# Patient Record
Sex: Female | Born: 1950 | Race: Black or African American | Hispanic: No | Marital: Single | Smoking: Never smoker
Health system: Southern US, Community
[De-identification: ages and names within clinical notes are randomized; demographics above are authoritative.]

## PROBLEM LIST (undated history)

## (undated) DIAGNOSIS — I10 Essential (primary) hypertension: Secondary | ICD-10-CM

## (undated) DIAGNOSIS — M1990 Unspecified osteoarthritis, unspecified site: Secondary | ICD-10-CM

## (undated) DIAGNOSIS — K219 Gastro-esophageal reflux disease without esophagitis: Secondary | ICD-10-CM

## (undated) DIAGNOSIS — E7800 Pure hypercholesterolemia, unspecified: Secondary | ICD-10-CM

## (undated) HISTORY — PX: ABDOMINAL HYSTERECTOMY: SHX81

## (undated) HISTORY — DX: Unspecified osteoarthritis, unspecified site: M19.90

## (undated) HISTORY — PX: CHOLECYSTECTOMY: SHX55

## (undated) HISTORY — PX: REPLACEMENT TOTAL KNEE BILATERAL: SUR1225

---

## 2015-10-05 ENCOUNTER — Emergency Department (HOSPITAL_COMMUNITY)
Admission: EM | Admit: 2015-10-05 | Discharge: 2015-10-05 | Disposition: A | Discharge status: Home / Self Care | Payer: Medicare Other | Attending: Emergency Medicine | Admitting: Emergency Medicine

## 2015-10-05 ENCOUNTER — Encounter (HOSPITAL_COMMUNITY): Payer: Self-pay | Admitting: Emergency Medicine

## 2015-10-05 DIAGNOSIS — L02221 Furuncle of abdominal wall: Secondary | ICD-10-CM | POA: Insufficient documentation

## 2015-10-05 DIAGNOSIS — L304 Erythema intertrigo: Secondary | ICD-10-CM | POA: Diagnosis not present

## 2015-10-05 DIAGNOSIS — L0292 Furuncle, unspecified: Secondary | ICD-10-CM

## 2015-10-05 DIAGNOSIS — R1904 Left lower quadrant abdominal swelling, mass and lump: Secondary | ICD-10-CM | POA: Diagnosis present

## 2015-10-05 LAB — CBG MONITORING, ED: GLUCOSE-CAPILLARY: 86 mg/dL (ref 65–99)

## 2015-10-05 MED ORDER — KETOCONAZOLE 2 % EX CREA: 1.0000 "application " | TOPICAL_CREAM | Freq: Every day | CUTANEOUS | Status: DC

## 2015-10-05 NOTE — Discharge Instructions (Signed)
Abscess An abscess is an infected area that contains a collection of pus and debris.It can occur in almost any part of the body. An abscess is also known as a furuncle or boil. CAUSES  An abscess occurs when tissue gets infected. This can occur from blockage of oil or sweat glands, infection of hair follicles, or a minor injury to the skin. As the body tries to fight the infection, pus collects in the area and creates pressure under the skin. This pressure causes pain. People with weakened immune systems have difficulty fighting infections and get certain abscesses more often.  SYMPTOMS Usually an abscess develops on the skin and becomes a painful mass that is red, warm, and tender. If the abscess forms under the skin, you may feel a moveable soft area under the skin. Some abscesses break open (rupture) on their own, but most will continue to get worse without care. The infection can spread deeper into the body and eventually into the bloodstream, causing you to feel ill.  DIAGNOSIS  Your caregiver will take your medical history and perform a physical exam. A sample of fluid may also be taken from the abscess to determine what is causing your infection. TREATMENT  Your caregiver may prescribe antibiotic medicines to fight the infection. However, taking antibiotics alone usually does not cure an abscess. Your caregiver may need to make a small cut (incision) in the abscess to drain the pus. In some cases, gauze is packed into the abscess to reduce pain and to continue draining the area. HOME CARE INSTRUCTIONS   Only take over-the-counter or prescription medicines for pain, discomfort, or fever as directed by your caregiver.  If you were prescribed antibiotics, take them as directed. Finish them even if you start to feel better.  If gauze is used, follow your caregiver's directions for changing the gauze.  To avoid spreading the infection:  Keep your draining abscess covered with a  bandage.  Wash your hands well.  Do not share personal care items, towels, or whirlpools with others.  Avoid skin contact with others.  Keep your skin and clothes clean around the abscess.  Keep all follow-up appointments as directed by your caregiver. SEEK MEDICAL CARE IF:   You have increased pain, swelling, redness, fluid drainage, or bleeding.  You have muscle aches, chills, or a general ill feeling.  You have a fever. MAKE SURE YOU:   Understand these instructions.  Will watch your condition.  Will get help right away if you are not doing well or get worse.   This information is not intended to replace advice given to you by your health care provider. Make sure you discuss any questions you have with your health care provider.   Document Released: 06/12/2005 Document Revised: 03/03/2012 Document Reviewed: 11/15/2011 Elsevier Interactive Patient Education 2016 [place] is a skin condition that occurs in between folds of skin in places on the body that rub together a lot and do not get much ventilation. It is caused by heat, moisture, friction, sweat retention, and lack of air circulation, which produces red, irritated patches and, sometimes, scaling or drainage. People who have diabetes, who are obese, or who have treatment with antibiotics are at increased risk for intertrigo. The most common sites for intertrigo to occur include:  The groin.  The breasts.  The armpits.  Folds of abdominal skin.  Webbed spaces between the fingers or toes. Intertrigo may be aggravated by:  Sweat.  Feces.  Yeast  or bacteria that are present near skin folds.  Urine.  Vaginal discharge. HOME CARE INSTRUCTIONS  The following steps can be taken to reduce friction and keep the affected area cool and dry:  Expose skin folds to the air.  Keep deep skin folds separated with cotton or linen cloth. Avoid tight fitting clothing that could cause  chafing.  Wear open-toed shoes or sandals to help reduce moisture between the toes.  Apply absorbent powders to affected areas as directed by your caregiver.  Apply over-the-counter barrier pastes, such as zinc oxide, as directed by your caregiver.  If you develop a fungal infection in the affected area, your caregiver may have you use antifungal creams. SEEK MEDICAL CARE IF:   The rash is not improving after 1 week of treatment.  The rash is getting worse (more red, more swollen, more painful, or spreading).  You have a fever or chills. MAKE SURE YOU:   Understand these instructions.  Will watch your condition.  Will get help right away if you are not doing well or get worse.   This information is not intended to replace advice given to you by your health care provider. Make sure you discuss any questions you have with your health care provider.   Document Released: 09/02/2005 Document Revised: 11/25/2011 Document Reviewed: 03/06/2015 Elsevier Interactive Patient Education [employer].

## 2015-10-05 NOTE — ED Notes (Signed)
Pt with abscess to lower abd pain that has purulent drainage x 3 days

## 2015-10-05 NOTE — ED Provider Notes (Signed)
CSN: [id number]     Arrival date & time 10/05/15  1028 History  By signing my name below, I, Essence Howell, attest that this documentation has been prepared under the direction and in the presence of Margarita Mail, PA-C Electronically Signed: Ladene Artist, ED Scribe 10/05/2015 at 11:33 AM.   Chief Complaint  Patient presents with  . Abscess   The history is provided by the patient. No language interpreter was used.   HPI Comments: Helen Fuller is a 64 y.o. female who presents to the Emergency Department complaining of an area of itching and swelling to the lower abdomen first noticed 4 days ago. Pt states that she first noticed the area with associated drainage 4 days ago. She has tried Vaseline and a bandage with mild relief. Pt denies fever, nausea, vomiting. She reports h/o abscesses.   History reviewed. No pertinent past medical history. History reviewed. No pertinent past surgical history. History reviewed. No pertinent family history. Social History  Substance Use Topics  . Smoking status: Never Smoker   . Smokeless tobacco: None  . Alcohol Use: No   OB History    No data available     Review of Systems  Constitutional: Negative for fever.  Gastrointestinal: Negative for nausea and vomiting.  Skin:       + Area of swelling to lower abdomen   Allergies  Tramadol  Home Medications   Prior to Admission medications   Not on File   BP 108/87 mmHg  Pulse 110  Temp(Src) 98.9 F (37.2 C) (Oral)  Resp 18  SpO2 95% Physical Exam  Constitutional: She is oriented to person, place, and time. She appears well-developed and well-nourished. No distress.  HENT:  Head: Normocephalic and atraumatic.  Eyes: Conjunctivae and EOM are normal.  Neck: Neck supple. No tracheal deviation present.  Cardiovascular: Normal rate.   Pulmonary/Chest: Effort normal. No respiratory distress.  Musculoskeletal: Normal range of motion.  Neurological: She is alert and oriented to person,  place, and time.  Skin: Skin is warm and dry.  Area of maceration under skin fold in L lower abdomen. Well-healing. No drainage, erythema, edema, cellulitis. Discoloration along skin fold, best suggested as yeast. Malodorous.   Psychiatric: She has a normal mood and affect. Her behavior is normal.  Nursing note and vitals reviewed.  ED Course  Procedures (including critical care time) DIAGNOSTIC STUDIES: Oxygen Saturation is 95% on RA, adequate by my interpretation.    COORDINATION OF CARE: 10:58 AM-Discussed treatment plan which includes CBG with pt at bedside and pt agreed to plan.   Labs Review Labs Reviewed  CBG MONITORING, ED   Imaging Review No results found. I have personally reviewed and evaluated these images and lab results as part of my medical decision-making.   EKG Interpretation None      MDM   Final diagnoses:  None   Pt has a well-healing small abscess on the underside of the abdominal pannus. No induration or fluctuance. Some ulceration of the tissue. No deeper abdominal tenderness. Her CBG is normal. No I&D neccessary. No concern for deeper infection. Has impetigo which we will treat with 2% ketoconazole and f/u with PCP. Discussed return precautions.   I personally performed the services described in this documentation, which was scribed in my presence. The recorded information has been reviewed and is accurate.      Margarita Mail, PA-C 10/05/15 [place], MD 10/06/15 [phone]

## 2016-01-02 ENCOUNTER — Emergency Department (HOSPITAL_COMMUNITY)
Admission: EM | Admit: 2016-01-02 | Discharge: 2016-01-02 | Disposition: A | Discharge status: Home / Self Care | Payer: Medicare Other | Attending: Emergency Medicine | Admitting: Emergency Medicine

## 2016-01-02 ENCOUNTER — Encounter (HOSPITAL_COMMUNITY): Payer: Self-pay | Admitting: *Deleted

## 2016-01-02 DIAGNOSIS — R300 Dysuria: Secondary | ICD-10-CM | POA: Diagnosis present

## 2016-01-02 DIAGNOSIS — Z8639 Personal history of other endocrine, nutritional and metabolic disease: Secondary | ICD-10-CM | POA: Diagnosis not present

## 2016-01-02 DIAGNOSIS — B9689 Other specified bacterial agents as the cause of diseases classified elsewhere: Secondary | ICD-10-CM

## 2016-01-02 DIAGNOSIS — I10 Essential (primary) hypertension: Secondary | ICD-10-CM | POA: Diagnosis not present

## 2016-01-02 DIAGNOSIS — Z8719 Personal history of other diseases of the digestive system: Secondary | ICD-10-CM | POA: Diagnosis not present

## 2016-01-02 DIAGNOSIS — N760 Acute vaginitis: Secondary | ICD-10-CM | POA: Insufficient documentation

## 2016-01-02 DIAGNOSIS — N390 Urinary tract infection, site not specified: Secondary | ICD-10-CM | POA: Diagnosis not present

## 2016-01-02 HISTORY — DX: Pure hypercholesterolemia, unspecified: E78.00

## 2016-01-02 HISTORY — DX: Gastro-esophageal reflux disease without esophagitis: K21.9

## 2016-01-02 HISTORY — DX: Essential (primary) hypertension: I10

## 2016-01-02 LAB — WET PREP, GENITAL
Sperm: NONE SEEN
TRICH WET PREP: NONE SEEN
Yeast Wet Prep HPF POC: NONE SEEN

## 2016-01-02 LAB — CBC WITH DIFFERENTIAL/PLATELET
BASOS ABS: 0 10*3/uL (ref 0.0–0.1)
BASOS PCT: 0 %
Eosinophils Absolute: 0.1 10*3/uL (ref 0.0–0.7)
Eosinophils Relative: 3 %
HEMATOCRIT: 38.4 % (ref 36.0–46.0)
Hemoglobin: 12.4 g/dL (ref 12.0–15.0)
LYMPHS PCT: 49 %
Lymphs Abs: 2.1 10*3/uL (ref 0.7–4.0)
MCH: 30.3 pg (ref 26.0–34.0)
MCHC: 32.3 g/dL (ref 30.0–36.0)
MCV: 93.9 fL (ref 78.0–100.0)
MONO ABS: 0.3 10*3/uL (ref 0.1–1.0)
Monocytes Relative: 6 %
NEUTROS ABS: 1.8 10*3/uL (ref 1.7–7.7)
NEUTROS PCT: 42 %
Platelets: 196 10*3/uL (ref 150–400)
RBC: 4.09 MIL/uL (ref 3.87–5.11)
RDW: 14 % (ref 11.5–15.5)
WBC: 4.3 10*3/uL (ref 4.0–10.5)

## 2016-01-02 LAB — BASIC METABOLIC PANEL
ANION GAP: 9 (ref 5–15)
BUN: 20 mg/dL (ref 6–20)
CALCIUM: 10.2 mg/dL (ref 8.9–10.3)
CO2: 22 mmol/L (ref 22–32)
Chloride: 108 mmol/L (ref 101–111)
Creatinine, Ser: 0.91 mg/dL (ref 0.44–1.00)
GLUCOSE: 124 mg/dL — AB (ref 65–99)
POTASSIUM: 4 mmol/L (ref 3.5–5.1)
SODIUM: 139 mmol/L (ref 135–145)

## 2016-01-02 LAB — URINALYSIS, ROUTINE W REFLEX MICROSCOPIC
Bilirubin Urine: NEGATIVE
Glucose, UA: NEGATIVE mg/dL
KETONES UR: NEGATIVE mg/dL
NITRITE: NEGATIVE
PROTEIN: NEGATIVE mg/dL
Specific Gravity, Urine: 1.017 (ref 1.005–1.030)
pH: 6 (ref 5.0–8.0)

## 2016-01-02 LAB — URINE MICROSCOPIC-ADD ON

## 2016-01-02 MED ORDER — METRONIDAZOLE 500 MG PO TABS: 500.0000 mg | ORAL_TABLET | Freq: Two times a day (BID) | ORAL | Status: DC

## 2016-01-02 MED ORDER — CEPHALEXIN 500 MG PO CAPS: 500.0000 mg | ORAL_CAPSULE | Freq: Two times a day (BID) | ORAL | Status: DC

## 2016-01-02 NOTE — ED Notes (Signed)
2-3 days of pain and burning with urination as well as some lower abdominal pain.  Pt also reports foul smelling vaginal discharge

## 2016-01-02 NOTE — ED Notes (Signed)
Pelvic cart at bedside. 

## 2016-01-02 NOTE — ED Provider Notes (Signed)
CSN: [id number]     Arrival date & time 01/02/16  1249 History  By signing my name below, I, Eustaquio Maize, attest that this documentation has been prepared under the direction and in the presence of [employer], PA-C.  Electronically Signed: Eustaquio Maize, ED Scribe. 01/02/2016. 2:55 PM.   Chief Complaint  Patient presents with  . Urinary Tract Infection   The history is provided by the patient. No language interpreter was used.     HPI Comments: Helen Fuller is a 65 y.o. female who presents to the Emergency Department complaining of gradual onset, constant, UTI like symptoms including dysuria and frequency x 3 days. Pt also complains of foul smelling cloudy urine, white vaginal discharge and vaginal itching x 2-3 days, nausea and intermittent right lower abdominal cramping. Pt used a SAV for her vaginal itching without relief. No hx UTI. Denies fever, chills, vomiting, or any other associated symptoms. PSHx partial hysterectomy and cholecystectomy.   Past Medical History  Diagnosis Date  . Hypertension   . GERD (gastroesophageal reflux disease)   . High cholesterol    Past Surgical History  Procedure Laterality Date  . Abdominal hysterectomy     No family history on file. Social History  Substance Use Topics  . Smoking status: Never Smoker   . Smokeless tobacco: None  . Alcohol Use: No   OB History    No data available     Review of Systems  Constitutional: Negative for fever and chills.  Gastrointestinal: Positive for nausea and abdominal pain. Negative for vomiting.  Genitourinary: Positive for dysuria, frequency and vaginal discharge.       + Vaginal itching  All other systems reviewed and are negative.  Allergies  Tramadol  Home Medications   Prior to Admission medications   Medication Sig Start Date End Date Taking? Authorizing Provider  ketoconazole (NIZORAL) 2 % cream Apply 1 application topically daily. For 10 days 10/05/15   Margarita Mail, PA-C    BP 117/83 mmHg  Pulse 74  Temp(Src) 97.9 F (36.6 C) (Oral)  Resp 18  Wt 216 lb (97.977 kg)  SpO2 98%   Physical Exam  Constitutional: She is oriented to person, place, and time. She appears well-developed and well-nourished. No distress.  HENT:  Head: Normocephalic and atraumatic.  Eyes: Conjunctivae and EOM are normal.  Neck: Neck supple. No tracheal deviation present.  Cardiovascular: Normal rate, regular rhythm and normal heart sounds.   Pulmonary/Chest: Effort normal and breath sounds normal. No respiratory distress.  Abdominal: Soft. Bowel sounds are normal. There is tenderness. There is no rigidity, no rebound, no guarding and no CVA tenderness.  Tenderness across the lower abdomen Old surgical scars present on the RUQ and the suprapubic region  Genitourinary: No vaginal discharge found.  Chaperone present.  Cervix surgically absent.  No adnexal tenderness.  No discharge visualized.   Musculoskeletal: Normal range of motion.  Neurological: She is alert and oriented to person, place, and time.  Skin: Skin is warm and dry.  Psychiatric: She has a normal mood and affect. Her behavior is normal.  Nursing note and vitals reviewed.   ED Course  Procedures (including critical care time)  DIAGNOSTIC STUDIES: Oxygen Saturation is 98% on RA, normal by my interpretation.    COORDINATION OF CARE: 2:52 PM-Discussed treatment plan which includes pelvic exam with pt at bedside and pt agreed to plan.   Labs Review Labs Reviewed  URINALYSIS, ROUTINE W REFLEX MICROSCOPIC (NOT AT [facility])  BASIC METABOLIC PANEL  CBC WITH DIFFERENTIAL/PLATELET    Imaging Review No results found. I have personally reviewed and evaluated these lab results as part of my medical decision-making.   EKG Interpretation None      MDM   Final diagnoses:  None  Patient presents today with complaints of vaginal discharge and also urinary symptoms.  UA is contaminated, but does show moderate  leukocytes and 6-30 WBC.  Urine cultured and patient started on antibiotics.  Wet prep showing clue cells consistent with BV.  Patient given Rx for Flagyl and instructed not to drink alcohol for the next week while taking the Flagyl.  Labs unremarkable.  Feel that the patient is stable for discharge.  Return precautions given.    I personally performed the services described in this documentation, which was scribed in my presence. The recorded information has been reviewed and is accurate.      Hyman Bible, PA-C 01/03/16 [place], MD 01/05/16 [phone]

## 2016-01-03 LAB — GC/CHLAMYDIA PROBE AMP (~~LOC~~) NOT AT ARMC
CHLAMYDIA, DNA PROBE: NEGATIVE
NEISSERIA GONORRHEA: NEGATIVE

## 2016-01-04 LAB — URINE CULTURE: SPECIAL REQUESTS: NORMAL

## 2016-01-05 ENCOUNTER — Telehealth: Payer: Self-pay

## 2016-01-05 NOTE — Telephone Encounter (Signed)
Post ED Visit - Positive Culture Follow-up: Successful Patient Follow-Up  Culture assessed and recommendations reviewed by: []  Elenor Quinones, Pharm.D. []  Heide Guile, Pharm.D., BCPS []  Parks Neptune, Pharm.D. []  Alycia Rossetti, Pharm.D., BCPS []  [place], Pharm.D., BCPS, AAHIVP []  Legrand Como, Pharm.D., BCPS, AAHIVP []  Milus Glazier, Pharm.D. []  Stephens November, Pharm.D. Carolan Shiver Pharm D Positive urine culture  []  Patient discharged without antimicrobial prescription and treatment is now indicated [x]  Organism is resistant to prescribed ED discharge antimicrobial []  Patient with positive blood cultures \\Changes  discussed with ED provider: Jackson Latino PA-C New antibiotic prescription Cipro 500 mg BID x 3 days  Stop Cephalexin Called to [facility]  [phone]  [facility])  Contacted patient, date 01/05/2016, time 1011   Helen Fuller, [name] 01/05/2016, 10:07 AM

## 2016-01-05 NOTE — Progress Notes (Signed)
ED Antimicrobial Stewardship Positive Culture Follow Up   Helen Fuller is an 65 y.o. female who presented to [facility] on 01/02/2016 with a chief complaint of  Chief Complaint  Patient presents with  . Urinary Tract Infection    Recent Results (from the past 720 hour(s))  Urine culture     Status: Abnormal   Collection Time: 01/02/16  2:46 PM  Result Value Ref Range Status   Specimen Description URINE, CLEAN CATCH  Final   Special Requests Normal  Final   Culture >=100,000 COLONIES/mL ESCHERICHIA COLI (A)  Final   Report Status 01/04/2016 FINAL  Final   Organism ID, Bacteria ESCHERICHIA COLI (A)  Final      Susceptibility   Escherichia coli - MIC*    AMPICILLIN >=32 RESISTANT Resistant     CEFAZOLIN >=64 RESISTANT Resistant     CEFTRIAXONE 16 INTERMEDIATE Intermediate     CIPROFLOXACIN <=0.25 SENSITIVE Sensitive     GENTAMICIN <=1 SENSITIVE Sensitive     IMIPENEM <=0.25 SENSITIVE Sensitive     NITROFURANTOIN <=16 SENSITIVE Sensitive     TRIMETH/SULFA <=20 SENSITIVE Sensitive     AMPICILLIN/SULBACTAM >=32 RESISTANT Resistant     PIP/TAZO 8 SENSITIVE Sensitive     * >=100,000 COLONIES/mL ESCHERICHIA COLI  Wet prep, genital     Status: Abnormal   Collection Time: 01/02/16  3:10 PM  Result Value Ref Range Status   Yeast Wet Prep HPF POC NONE SEEN NONE SEEN Final   Trich, Wet Prep NONE SEEN NONE SEEN Final   Clue Cells Wet Prep HPF POC PRESENT (A) NONE SEEN Final   WBC, Wet Prep HPF POC FEW (A) NONE SEEN Final   Sperm NONE SEEN  Final    [x]  Treated with cephalexin, organism resistant to prescribed antimicrobial  New antibiotic prescription: Stop cephalexin and start taking ciprofloxacin 500mg  BID x3 days.   ED Provider: Jackson Latino, PA-C   Carlotta Telfair C. Lennox Grumbles, PharmD Pharmacy Resident  Pager: [phone] 01/05/2016 9:01 AM

## 2016-04-23 ENCOUNTER — Encounter: Payer: Self-pay | Admitting: Emergency Medicine

## 2016-04-23 ENCOUNTER — Ambulatory Visit (INDEPENDENT_AMBULATORY_CARE_PROVIDER_SITE_OTHER): Payer: Medicare Other | Admitting: Emergency Medicine

## 2016-04-23 VITALS — BP 124/80 | HR 78 | Temp 98.3°F | Resp 18 | Ht 65.5 in | Wt 217.0 lb

## 2016-04-23 DIAGNOSIS — R21 Rash and other nonspecific skin eruption: Secondary | ICD-10-CM

## 2016-04-23 LAB — POCT SKIN KOH: Skin KOH, POC: NEGATIVE

## 2016-04-23 MED ORDER — ERYTHROMYCIN 5 MG/GM OP OINT: 1.0000 "application " | TOPICAL_OINTMENT | Freq: Four times a day (QID) | OPHTHALMIC | 0 refills | Status: DC

## 2016-04-23 NOTE — Progress Notes (Signed)
Patient ID: Helen Fuller, female   DOB: 15-Apr-1951, 65 y.o.   MRN: [id number]    By signing my name below, I, Essence Howell, attest that this documentation has been prepared under the direction and in the presence of Darlyne Russian, MD Electronically Signed: Ladene Artist, ED Scribe 04/23/2016 at 8:29 AM.  Chief Complaint:  Chief Complaint  Patient presents with  . Rash    on face area   HPI: Helen Fuller is a 65 y.o. female who reports to [facility] today complaining of a pruritic facial rash first noticed 2 days ago. Pt states that she never wears makeup but she recently purchased makeup at a yard sale 2 days ago and tried on makeup at [employer] for her wedding that is 4 days away in [place], [place]. She also states that she has been cleaning a rug for the past few days. She denies new foods.   Past Medical History:  Diagnosis Date  . GERD (gastroesophageal reflux disease)   . High cholesterol   . Hypertension    Past Surgical History:  Procedure Laterality Date  . ABDOMINAL HYSTERECTOMY     Social History   Social History  . Marital status: Single    Spouse name: N/A  . Number of children: N/A  . Years of education: N/A   Social History Main Topics  . Smoking status: Never Smoker  . Smokeless tobacco: Never Used  . Alcohol use No  . Drug use: No  . Sexual activity: Not Asked   Other Topics Concern  . None   Social History Narrative  . None   History reviewed. No pertinent family history. Allergies  Allergen Reactions  . Tramadol    Prior to Admission medications   Medication Sig Start Date End Date Taking? Authorizing Provider  cephALEXin (KEFLEX) 500 MG capsule Take 1 capsule (500 mg total) by mouth 2 (two) times daily. Patient not taking: Reported on 04/23/2016 01/02/16   Hyman Bible, PA-C  ketoconazole (NIZORAL) 2 % cream Apply 1 application topically daily. For 10 days Patient not taking: Reported on 04/23/2016 10/05/15   Margarita Mail, PA-C  metroNIDAZOLE  (FLAGYL) 500 MG tablet Take 1 tablet (500 mg total) by mouth 2 (two) times daily. Patient not taking: Reported on 04/23/2016 01/02/16   Hyman Bible, PA-C     ROS: The patient denies fevers, chills, night sweats, unintentional weight loss, chest pain, palpitations, wheezing, dyspnea on exertion, nausea, vomiting, abdominal pain, dysuria, hematuria, melena, numbness, weakness, or tingling.  All other systems have been reviewed and were otherwise negative with the exception of those mentioned in the HPI and as above.    PHYSICAL EXAM: Vitals:   04/23/16 0813  BP: 124/80  Pulse: 78  Resp: 18  Temp: 98.3 F (36.8 C)   Body mass index is 35.56 kg/m.  General: Alert, no acute distress HEENT:  Normocephalic, atraumatic, oropharynx patent. Eye: Juliette Mangle [facility] Cardiovascular:  Regular rate and rhythm, no rubs murmurs or gallops.  No Carotid bruits, radial pulse intact. No pedal edema.  Respiratory: Clear to auscultation bilaterally.  No wheezes, rales, or rhonchi.  No cyanosis, no use of accessory musculature Abdominal: No organomegaly, abdomen is soft and non-tender, positive bowel sounds.  No masses. Musculoskeletal: Gait intact. No edema, tenderness Skin: Dry scaly rash primarily involving the forehead with a few macular papular areas beneath both eyes. 3 mm abraded area to the lateral upper lid.  Neurologic: Facial musculature symmetric. Psychiatric: Patient acts appropriately throughout our interaction. Lymphatic:  No cervical or submandibular lymphadenopathy  LABS:  EKG/XRAY:   Primary read interpreted by Dr. Everlene Farrier at [facility].   ASSESSMENT/PLAN:  She has an abrasion of her upper lid and will treat this with erythromycin ointment. She can use 1% hydrocortisone cream to the areas for head and beneath both eyes twice a day. KOH did not reveal any hyphae. I suspect this is secondary to  makeup she placed on her face.I personally performed the services described in this documentation,  which was scribed in my presence. The recorded information has been reviewed and is accurate.   Gross sideeffects, risk and benefits, and alternatives of medications d/w patient. Patient is aware that all medications have potential sideeffects and we are unable to predict every sideeffect or drug-drug interaction that may occur.  Arlyss Queen MD 04/23/2016 8:15 AM

## 2016-04-23 NOTE — Patient Instructions (Addendum)
Apply 1% hydrocortisone cream to you for head and beneath both eyes 3 times a day. You have an antibiotic ointment to use on the Lid of your right.eye    IF you received an x-ray today, you will receive an invoice from [facility]. Please contact [facility] at [phone] with questions or concerns regarding your invoice.   IF you received labwork today, you will receive an invoice from [employer]. Please contact [facility] at [phone] with questions or concerns regarding your invoice.   Our billing staff will not be able to assist you with questions regarding bills from these companies.  You will be contacted with the lab results as soon as they are available. The fastest way to get your results is to activate your My Chart account. Instructions are located on the last page of this paperwork. If you have not heard from [place] regarding the results in 2 weeks, please contact this office.      We recommend that you schedule a mammogram for breast cancer screening. Typically, you do not need a referral to do this. Please contact a local imaging center to schedule your mammogram.  [facility] - [phone]  *ask for the Radiology Department [facility] ([place]) - [phone] or [phone]  [facility] - [phone] [place] [phone] [facility] - [phone]  *ask for the [facility] - [phone]  *ask for the Radiology Department [facility] - [phone]  *ask for the [facility] - [phone]

## 2016-10-16 ENCOUNTER — Other Ambulatory Visit: Payer: Self-pay | Admitting: Internal Medicine

## 2016-10-16 ENCOUNTER — Ambulatory Visit (HOSPITAL_COMMUNITY)
Admission: EM | Admit: 2016-10-16 | Discharge: 2016-10-16 | Disposition: A | Discharge status: Home / Self Care | Payer: Medicare Other | Attending: Family Medicine | Admitting: Family Medicine

## 2016-10-16 ENCOUNTER — Encounter (HOSPITAL_COMMUNITY): Payer: Self-pay | Admitting: Emergency Medicine

## 2016-10-16 DIAGNOSIS — R319 Hematuria, unspecified: Secondary | ICD-10-CM | POA: Diagnosis not present

## 2016-10-16 DIAGNOSIS — Z1231 Encounter for screening mammogram for malignant neoplasm of breast: Secondary | ICD-10-CM

## 2016-10-16 DIAGNOSIS — E2839 Other primary ovarian failure: Secondary | ICD-10-CM

## 2016-10-16 DIAGNOSIS — R109 Unspecified abdominal pain: Secondary | ICD-10-CM | POA: Diagnosis present

## 2016-10-16 DIAGNOSIS — N200 Calculus of kidney: Secondary | ICD-10-CM

## 2016-10-16 LAB — POCT URINALYSIS DIP (DEVICE)
BILIRUBIN URINE: NEGATIVE
Glucose, UA: NEGATIVE mg/dL
Ketones, ur: NEGATIVE mg/dL
NITRITE: NEGATIVE
Protein, ur: >=300 mg/dL — AB
Specific Gravity, Urine: 1.025 (ref 1.005–1.030)
UROBILINOGEN UA: 0.2 mg/dL (ref 0.0–1.0)
pH: 6 (ref 5.0–8.0)

## 2016-10-16 MED ORDER — TAMSULOSIN HCL 0.4 MG PO CAPS: 0.4000 mg | ORAL_CAPSULE | Freq: Every day | ORAL | 0 refills | Status: DC

## 2016-10-16 MED ORDER — PHENAZOPYRIDINE HCL 200 MG PO TABS: 200.0000 mg | ORAL_TABLET | Freq: Three times a day (TID) | ORAL | 0 refills | Status: DC | PRN

## 2016-10-16 MED ORDER — CEPHALEXIN 500 MG PO CAPS: 500.0000 mg | ORAL_CAPSULE | Freq: Four times a day (QID) | ORAL | 0 refills | Status: DC

## 2016-10-16 MED ORDER — KETOROLAC TROMETHAMINE 30 MG/ML IJ SOLN
30.0000 mg | Freq: Once | INTRAMUSCULAR | Status: AC
  Administered 2016-10-16: 30 mg via INTRAMUSCULAR

## 2016-10-16 MED ORDER — KETOROLAC TROMETHAMINE 30 MG/ML IJ SOLN
INTRAMUSCULAR | Status: AC
  Filled 2016-10-16: qty 1

## 2016-10-16 MED ORDER — KETOROLAC TROMETHAMINE 10 MG PO TABS: 10.0000 mg | ORAL_TABLET | Freq: Four times a day (QID) | ORAL | 0 refills | Status: DC | PRN

## 2016-10-16 NOTE — ED Triage Notes (Signed)
See s/s 

## 2016-10-16 NOTE — Discharge Instructions (Signed)
I believe you most likely have kidney stones. You have been given a medicine in clinic called toradol for pain. I have also sent a prescription for Toradol to your pharmacy. This medicine can be harmful on your kidneys so drink lots of water. I have also prescribed a medicine called Flomax, this will open up your ureters and help a stone to pass. If you are not experiencing relief in 2-3 days, 4 at the most, go to the emergency room for further evaluation.  There is also a possibility you may have an infection, therefore I am covering for a UTI. I have sent a prescription for Keflex, take 1 tablet 4 times a day for 4 days and pyridium 1 tablet three times a day for 3 days.  If you are not feeling better, follow up with a primary care provider or go to the ER.

## 2016-10-16 NOTE — ED Provider Notes (Signed)
CSN: [id number]     Arrival date & time 10/16/16  1927 History   First MD Initiated Contact with Patient 10/16/16 2044     Chief Complaint  Patient presents with  . Abdominal Pain   (Consider location/radiation/quality/duration/timing/severity/associated sxs/prior Treatment) 66 year old female presents to clinic with chief complaint of dysuria, urgency, frequency, flank pain, and pubic pain for last two days. She reports she has not had previous signs or symptoms like these in the past. She denies fever but has had some nausea today. She also reports seeing some blood in her urine as well.   The history is provided by the patient.    Past Medical History:  Diagnosis Date  . GERD (gastroesophageal reflux disease)   . High cholesterol   . Hypertension    Past Surgical History:  Procedure Laterality Date  . ABDOMINAL HYSTERECTOMY     No family history on file. Social History  Substance Use Topics  . Smoking status: Never Smoker  . Smokeless tobacco: Never Used  . Alcohol use No   OB History    No data available     Review of Systems  Reason unable to perform ROS: as covered in HPI.  All other systems reviewed and are negative.   Allergies  Tramadol  Home Medications   Prior to Admission medications   Medication Sig Start Date End Date Taking? Authorizing Provider  hydrochlorothiazide (HYDRODIURIL) 25 MG tablet Take 25 mg by mouth daily.   Yes Historical Provider, MD  metoprolol (LOPRESSOR) 100 MG tablet Take by mouth 2 (two) times daily.   Yes Historical Provider, MD  omeprazole (PRILOSEC) 20 MG capsule Take 20 mg by mouth daily.   Yes Historical Provider, MD  cephALEXin (KEFLEX) 500 MG capsule Take 1 capsule (500 mg total) by mouth 2 (two) times daily. Patient not taking: Reported on 04/23/2016 01/02/16   Hyman Bible, PA-C  cephALEXin (KEFLEX) 500 MG capsule Take 1 capsule (500 mg total) by mouth 4 (four) times daily. 10/16/16   Barnet Glasgow, NP  erythromycin  ophthalmic ointment Place 1 application into the right eye 4 (four) times daily. Patient not taking: Reported on 10/16/2016 04/23/16   Darlyne Russian, MD  ketoconazole (NIZORAL) 2 % cream Apply 1 application topically daily. For 10 days Patient not taking: Reported on 04/23/2016 10/05/15   Margarita Mail, PA-C  ketorolac (TORADOL) 10 MG tablet Take 1 tablet (10 mg total) by mouth every 6 (six) hours as needed. 10/16/16   Barnet Glasgow, NP  metroNIDAZOLE (FLAGYL) 500 MG tablet Take 1 tablet (500 mg total) by mouth 2 (two) times daily. Patient not taking: Reported on 04/23/2016 01/02/16   Hyman Bible, PA-C  phenazopyridine (PYRIDIUM) 200 MG tablet Take 1 tablet (200 mg total) by mouth 3 (three) times daily as needed for pain. 10/16/16   Barnet Glasgow, NP  tamsulosin (FLOMAX) 0.4 MG CAPS capsule Take 1 capsule (0.4 mg total) by mouth daily. 10/16/16   Barnet Glasgow, NP   Meds Ordered and Administered this Visit   Medications  ketorolac (TORADOL) 30 MG/ML injection 30 mg (30 mg Intramuscular Given 10/16/16 2054)    BP 110/75 (BP Location: Right Arm)   Pulse 69   Temp 98.5 F (36.9 C) (Oral)   Resp 18   SpO2 95%  No data found.   Physical Exam  Constitutional: She is oriented to person, place, and time. She appears well-developed and well-nourished. She appears distressed.  Cardiovascular: Normal rate and regular rhythm.  Pulmonary/Chest: Effort normal.  Abdominal: Soft. Bowel sounds are normal. She exhibits no distension and no mass. There is tenderness in the suprapubic area. There is CVA tenderness. There is no rigidity, no rebound, no guarding, no tenderness at McBurney's point and negative Murphy's sign.  Musculoskeletal:  Bilateral flank pain  Neurological: She is alert and oriented to person, place, and time.  Skin: Skin is warm and dry. Capillary refill takes less than 2 seconds. She is not diaphoretic.  Psychiatric: She has a normal mood and affect.  Nursing note and vitals  reviewed.   Urgent Care Course     Procedures (including critical care time)  Labs Review Labs Reviewed  POCT URINALYSIS DIP (DEVICE) - Abnormal; Notable for the following:       Result Value   Hgb urine dipstick LARGE (*)    Protein, ur >=300 (*)    Leukocytes, UA SMALL (*)    All other components within normal limits  URINE CULTURE    Imaging Review No results found.   Visual Acuity Review  Right Eye Distance:   Left Eye Distance:   Bilateral Distance:    Right Eye Near:   Left Eye Near:    Bilateral Near:         MDM   1. Kidney stone   I believe you most likely have kidney stones. You have been given a medicine in clinic called toradol for pain. I have also sent a prescription for Toradol to your pharmacy. This medicine can be harmful on your kidneys so drink lots of water. I have also prescribed a medicine called Flomax, this will open up your ureters and help a stone to pass. If you are not experiencing relief in 2-3 days, 4 at the most, go to the emergency room for further evaluation.  There is also a possibility you may have an infection, therefore I am covering for a UTI. I have sent a prescription for Keflex, take 1 tablet 4 times a day for 4 days and pyridium 1 tablet three times a day for 3 days.  If you are not feeling better, follow up with a primary care provider or go to the ER.     Barnet Glasgow, NP 10/16/16 2106

## 2016-10-19 LAB — URINE CULTURE

## 2016-11-04 ENCOUNTER — Ambulatory Visit
Admission: RE | Admit: 2016-11-04 | Discharge: 2016-11-04 | Disposition: A | Discharge status: Home / Self Care | Payer: Medicare Other | Source: Ambulatory Visit | Attending: Internal Medicine | Admitting: Internal Medicine

## 2016-11-04 DIAGNOSIS — Z1231 Encounter for screening mammogram for malignant neoplasm of breast: Secondary | ICD-10-CM

## 2016-11-04 DIAGNOSIS — E2839 Other primary ovarian failure: Secondary | ICD-10-CM

## 2018-02-23 ENCOUNTER — Other Ambulatory Visit: Payer: Self-pay | Admitting: Internal Medicine

## 2018-02-23 DIAGNOSIS — Z1231 Encounter for screening mammogram for malignant neoplasm of breast: Secondary | ICD-10-CM

## 2018-03-04 ENCOUNTER — Other Ambulatory Visit: Payer: Self-pay | Admitting: Internal Medicine

## 2018-03-04 DIAGNOSIS — E2839 Other primary ovarian failure: Secondary | ICD-10-CM

## 2018-03-05 ENCOUNTER — Encounter: Payer: Self-pay | Admitting: Gastroenterology

## 2018-03-12 ENCOUNTER — Ambulatory Visit
Admission: RE | Admit: 2018-03-12 | Discharge: 2018-03-12 | Disposition: A | Discharge status: Home / Self Care | Payer: Medicare Other | Source: Ambulatory Visit | Attending: Internal Medicine | Admitting: Internal Medicine

## 2018-03-12 DIAGNOSIS — Z1231 Encounter for screening mammogram for malignant neoplasm of breast: Secondary | ICD-10-CM

## 2018-04-17 ENCOUNTER — Encounter: Payer: Self-pay | Admitting: Internal Medicine

## 2018-07-15 ENCOUNTER — Ambulatory Visit (INDEPENDENT_AMBULATORY_CARE_PROVIDER_SITE_OTHER): Payer: Medicare Other | Admitting: Podiatry

## 2018-07-15 ENCOUNTER — Encounter: Payer: Self-pay | Admitting: Podiatry

## 2018-07-15 VITALS — BP 138/84 | HR 64

## 2018-07-15 DIAGNOSIS — B351 Tinea unguium: Secondary | ICD-10-CM

## 2018-07-15 DIAGNOSIS — L603 Nail dystrophy: Secondary | ICD-10-CM | POA: Diagnosis not present

## 2018-07-15 DIAGNOSIS — M79676 Pain in unspecified toe(s): Secondary | ICD-10-CM

## 2018-07-20 NOTE — Progress Notes (Signed)
   Subjective: 67 year old female presenting today as a new patient with a chief complaint of ingrowing nails of bilateral great toes that have been present intermittently for the past few years. She also reports elongated, thickened nails that cause pain while ambulating in shoes. She is unable to trim her own nails. Wearing certain shoes and applying pressure to the toes increases the pain. She has not done anything for treatment. Patient is here for further evaluation and treatment.   Past Medical History:  Diagnosis Date  . GERD (gastroesophageal reflux disease)   . High cholesterol   . Hypertension     Objective:  General: Well developed, nourished, in no acute distress, alert and oriented x3   Dermatology: Hyperkeratotic, discolored, thickened, onychodystrophy of the right great toenail. Skin is warm, dry and supple bilateral lower extremities. Negative for open lesions or macerations. Nails are tender, long, thickened and dystrophic with subungual debris, consistent with onychomycosis, 1-5 bilateral. No signs of infection noted.  Vascular: Dorsalis Pedis artery and Posterior Tibial artery pedal pulses palpable. No lower extremity edema noted.   Neruologic: Grossly intact via light touch bilateral.  Musculoskeletal: Muscular strength within normal limits in all groups bilateral. Normal range of motion noted to all pedal and ankle joints.   Assessment:  #1 Dystrophic, painful nail of the right hallux #2 Onychodystrophic nails 1-5 bilateral with hyperkeratosis of nails.  #3 Onychomycosis of nail due to dermatophyte bilateral   Plan of Care:  1. Patient evaluated.  2. Discussed treatment alternatives and plan of care. Explained nail avulsion procedure and post procedure course to patient. 3. Patient opted for total temporary nail avulsion.  4. Prior to procedure, local anesthesia infiltration utilized using 3 ml of a 50:50 mixture of 2% plain lidocaine and 0.5% plain marcaine in a  normal hallux block fashion and a betadine prep performed.  5. Light dressing applied. 6. Instructed to maintain good pedal hygiene and foot care.  7. Mechanical debridement of nails 1-5 bilaterally performed using a nail nipper. Filed with dremel without incident.  8. Return to clinic as needed.     Edrick Kins, DPM [facility]  Dr. Edrick Kins, [address]                                        [place], [place] [address]                Office [phone]  Fax [phone]

## 2018-10-30 ENCOUNTER — Encounter (HOSPITAL_COMMUNITY): Payer: Self-pay | Admitting: Emergency Medicine

## 2018-10-30 ENCOUNTER — Encounter (HOSPITAL_COMMUNITY): Payer: Self-pay

## 2018-10-30 ENCOUNTER — Emergency Department (HOSPITAL_COMMUNITY)
Admission: EM | Admit: 2018-10-30 | Discharge: 2018-10-31 | Disposition: A | Discharge status: Home / Self Care | Payer: Medicare Other | Attending: Emergency Medicine | Admitting: Emergency Medicine

## 2018-10-30 ENCOUNTER — Ambulatory Visit (INDEPENDENT_AMBULATORY_CARE_PROVIDER_SITE_OTHER)
Admission: EM | Admit: 2018-10-30 | Discharge: 2018-10-30 | Disposition: A | Discharge status: Home / Self Care | Payer: Medicare Other | Source: Home / Self Care

## 2018-10-30 DIAGNOSIS — Z79899 Other long term (current) drug therapy: Secondary | ICD-10-CM | POA: Insufficient documentation

## 2018-10-30 DIAGNOSIS — R1011 Right upper quadrant pain: Secondary | ICD-10-CM

## 2018-10-30 DIAGNOSIS — I10 Essential (primary) hypertension: Secondary | ICD-10-CM | POA: Insufficient documentation

## 2018-10-30 DIAGNOSIS — E7800 Pure hypercholesterolemia, unspecified: Secondary | ICD-10-CM | POA: Insufficient documentation

## 2018-10-30 DIAGNOSIS — R1084 Generalized abdominal pain: Secondary | ICD-10-CM | POA: Diagnosis present

## 2018-10-30 DIAGNOSIS — R197 Diarrhea, unspecified: Secondary | ICD-10-CM | POA: Diagnosis not present

## 2018-10-30 DIAGNOSIS — R1031 Right lower quadrant pain: Secondary | ICD-10-CM | POA: Diagnosis not present

## 2018-10-30 DIAGNOSIS — K529 Noninfective gastroenteritis and colitis, unspecified: Secondary | ICD-10-CM

## 2018-10-30 LAB — CBC
HCT: 46.2 % — ABNORMAL HIGH (ref 36.0–46.0)
Hemoglobin: 15.5 g/dL — ABNORMAL HIGH (ref 12.0–15.0)
MCH: 31.1 pg (ref 26.0–34.0)
MCHC: 33.5 g/dL (ref 30.0–36.0)
MCV: 92.8 fL (ref 80.0–100.0)
Platelets: 262 10*3/uL (ref 150–400)
RBC: 4.98 MIL/uL (ref 3.87–5.11)
RDW: 13.7 % (ref 11.5–15.5)
WBC: 6.6 10*3/uL (ref 4.0–10.5)
nRBC: 0 % (ref 0.0–0.2)

## 2018-10-30 LAB — URINALYSIS, ROUTINE W REFLEX MICROSCOPIC
Bacteria, UA: NONE SEEN
Bilirubin Urine: NEGATIVE
Glucose, UA: NEGATIVE mg/dL
Ketones, ur: NEGATIVE mg/dL
Leukocytes,Ua: NEGATIVE
NITRITE: NEGATIVE
Protein, ur: NEGATIVE mg/dL
Specific Gravity, Urine: 1.017 (ref 1.005–1.030)
pH: 5 (ref 5.0–8.0)

## 2018-10-30 LAB — COMPREHENSIVE METABOLIC PANEL
ALT: 43 U/L (ref 0–44)
AST: 45 U/L — ABNORMAL HIGH (ref 15–41)
Albumin: 4 g/dL (ref 3.5–5.0)
Alkaline Phosphatase: 65 U/L (ref 38–126)
Anion gap: 11 (ref 5–15)
BUN: 18 mg/dL (ref 8–23)
CO2: 22 mmol/L (ref 22–32)
Calcium: 10.3 mg/dL (ref 8.9–10.3)
Chloride: 105 mmol/L (ref 98–111)
Creatinine, Ser: 1.1 mg/dL — ABNORMAL HIGH (ref 0.44–1.00)
GFR calc Af Amer: >60 mL/min (ref >60)
GFR calc non Af Amer: 52 mL/min — ABNORMAL LOW (ref >60)
Glucose, Bld: 116 mg/dL — ABNORMAL HIGH (ref 70–99)
Potassium: 4.6 mmol/L (ref 3.5–5.1)
Sodium: 138 mmol/L (ref 135–145)
Total Bilirubin: 1.5 mg/dL — ABNORMAL HIGH (ref 0.3–1.2)
Total Protein: 7.8 g/dL (ref 6.5–8.1)

## 2018-10-30 LAB — LIPASE, BLOOD: Lipase: 25 U/L (ref 11–51)

## 2018-10-30 MED ORDER — SODIUM CHLORIDE 0.9% FLUSH: 3.0000 mL | Freq: Once | INTRAVENOUS | Status: DC

## 2018-10-30 MED ORDER — MORPHINE SULFATE (PF) 4 MG/ML IV SOLN
4.0000 mg | Freq: Once | INTRAVENOUS | Status: AC
  Administered 2018-10-31: 4 mg via INTRAVENOUS
  Filled 2018-10-30: qty 1

## 2018-10-30 MED ORDER — ONDANSETRON HCL 4 MG/2ML IJ SOLN
4.0000 mg | Freq: Once | INTRAMUSCULAR | Status: AC
  Administered 2018-10-31: 4 mg via INTRAVENOUS
  Filled 2018-10-30: qty 2

## 2018-10-30 MED ORDER — SODIUM CHLORIDE 0.9 % IV BOLUS
500.0000 mL | Freq: Once | INTRAVENOUS | Status: AC
  Administered 2018-10-31: 500 mL via INTRAVENOUS

## 2018-10-30 NOTE — ED Triage Notes (Addendum)
Pt c/o generalized abdominal cramping with diarrhea. States "its been going on a month but its gotten worse" pt states "I always have diarrhea but I went out to eat today and I couldn't make it home without defecating on myself".

## 2018-10-30 NOTE — ED Triage Notes (Signed)
Pt states that she has been having RLQ abd pain for the past two week with nausea and diarrhea, went to UC and told to come here for a better work up.

## 2018-10-30 NOTE — Discharge Instructions (Addendum)
Recommending further evaluation and management in the ED for RLQ and RUQ pain that has worsened in the last few days.  Patient and family aware and in agreement with this plan.  Will go to ED by private vehicle.

## 2018-10-30 NOTE — ED Provider Notes (Signed)
[place]   [id number] 10/30/18 Arrival Time: 78  CC: ABDOMINAL DISCOMFORT  SUBJECTIVE:  Helen Fuller is a 68 y.o. female hx significant for cholecystectomy, GERD, HLD, and HTN, who presents with complaint of intermittent abdominal cramping and frequent diarrhea after every meal for the past month.  Denies a precipitating event, trauma, close contacts with similar symptoms, recent travel or antibiotic use.  Cramping is diffuse about the abdomen.  Describes as intermittent and worsening.  Pain 10/10.  Has tried OTC medications without relief.  Symptoms made worse with eating fatty, greasy foods as well as dairy products.  Denies similar symptoms in the past.  Last BM today with diarrhea.  Complains of dysuria, and bowel urgency prior to episodes of diarrhea.  Had an episode earlier today where she was unable to make it to the restroom in time.    Denies fever, chills, appetite changes, weight changes, nausea, vomiting, chest pain, SOB, diarrhea, constipation, hematochezia, melena, difficulty urinating, increased frequency or urgency, flank pain, loss of bowel or bladder function.  No LMP recorded. Patient has had a hysterectomy.  ROS: As per HPI.  Past Medical History:  Diagnosis Date  . GERD (gastroesophageal reflux disease)   . High cholesterol   . Hypertension    Past Surgical History:  Procedure Laterality Date  . ABDOMINAL HYSTERECTOMY    . CHOLECYSTECTOMY     Allergies  Allergen Reactions  . Penicillins   . Tramadol    No current facility-administered medications on file prior to encounter.    Current Outpatient Medications on File Prior to Encounter  Medication Sig Dispense Refill  . hydrochlorothiazide (HYDRODIURIL) 25 MG tablet Take 25 mg by mouth daily.    . metoprolol (LOPRESSOR) 100 MG tablet Take by mouth 2 (two) times daily.    Marland Kitchen omeprazole (PRILOSEC) 20 MG capsule Take 20 mg by mouth daily.     Social History   Socioeconomic History  .  Marital status: Single    Spouse name: Not on file  . Number of children: Not on file  . Years of education: Not on file  . Highest education level: Not on file  Occupational History  . Not on file  Social Needs  . Financial resource strain: Not on file  . Food insecurity:    Worry: Not on file    Inability: Not on file  . Transportation needs:    Medical: Not on file    Non-medical: Not on file  Tobacco Use  . Smoking status: Never Smoker  . Smokeless tobacco: Never Used  Substance and Sexual Activity  . Alcohol use: No  . Drug use: No  . Sexual activity: Not on file  Lifestyle  . Physical activity:    Days per week: Not on file    Minutes per session: Not on file  . Stress: Not on file  Relationships  . Social connections:    Talks on phone: Not on file    Gets together: Not on file    Attends religious service: Not on file    Active member of club or organization: Not on file    Attends meetings of clubs or organizations: Not on file    Relationship status: Not on file  . Intimate partner violence:    Fear of current or ex partner: Not on file    Emotionally abused: Not on file    Physically abused: Not on file    Forced sexual activity: Not on file  Other Topics  Concern  . Not on file  Social History Narrative  . Not on file   Family History  Problem Relation Age of Onset  . Breast cancer Neg Hx      OBJECTIVE:  Vitals:   10/30/18 1810  BP: 102/72  Pulse: 100  Resp: 18  Temp: 98.7 F (37.1 C)  SpO2: 99%    General appearance: Alert; NAD HEENT: NCAT.  Oropharynx clear.  Lungs: clear to auscultation bilaterally without adventitious breath sounds Heart: regular rate and rhythm.  Radial pulses 2+ symmetrical bilaterally Abdomen: Skin: Tranverse scar over RUQ, and vertical scan over RLQ; soft, protuberant, non-distended; normal active bowel sounds ;TTP over RUQ and RLQ; negative rebound; no guarding Back: no CVA tenderness Extremities: no edema;  symmetrical with no gross deformities Skin: warm and dry Neurologic: normal gait Psychological: alert and cooperative; normal mood and affect  ASSESSMENT & PLAN:  1. Right lower quadrant abdominal pain   2. Chronic diarrhea   3. Abdominal pain, right upper quadrant     Further evaluation and treatment deferred to ED.  Recommending further evaluation and management in the ED for RLQ and RUQ pain that has worsened in the last few days.  Patient and family aware and in agreement with this plan.  Will go to ED by private vehicle.       Lestine Box, PA-C 10/30/18 2143

## 2018-10-30 NOTE — ED Provider Notes (Signed)
[name] EMERGENCY DEPARTMENT Provider Note   CSN: [id number] Arrival date & time: 10/30/18  1942     History   Chief Complaint Chief Complaint  Patient presents with  . Abdominal Pain    HPI Helen Fuller is a 68 y.o. female.  Patient presents to the emergency department for evaluation of abdominal pain.  Symptoms have been ongoing for at least 2 weeks if not longer.  She has been experiencing predominantly right-sided pain and cramping.  This happens after she eats.  Every time she eats she gets the pain, cramping and then has a diarrhea stool.  No rectal bleeding or melena.     Past Medical History:  Diagnosis Date  . GERD (gastroesophageal reflux disease)   . High cholesterol   . Hypertension     There are no active problems to display for this patient.   Past Surgical History:  Procedure Laterality Date  . ABDOMINAL HYSTERECTOMY    . CHOLECYSTECTOMY       OB History   No obstetric history on file.      Home Medications    Prior to Admission medications   Medication Sig Start Date End Date Taking? Authorizing Provider  hydrochlorothiazide (HYDRODIURIL) 25 MG tablet Take 25 mg by mouth daily.   Yes [provider]  metoprolol (LOPRESSOR) 100 MG tablet Take 100 mg by mouth daily as needed (palpitations).    Yes [provider]  omeprazole (PRILOSEC) 20 MG capsule Take 20 mg by mouth daily.   Yes [provider]  diphenoxylate-atropine (LOMOTIL) 2.5-0.025 MG tablet Take 1 tablet by mouth 4 (four) times daily as needed for diarrhea or loose stools. 10/31/18   Pollina, Gwenyth Allegra, MD  hyoscyamine (LEVSIN SL) 0.125 MG SL tablet Place 1 tablet (0.125 mg total) under the tongue every 4 (four) hours as needed. 10/31/18   Orpah Greek, MD    Family History Family History  Problem Relation Age of Onset  . Breast cancer Neg Hx     Social History Social History   Tobacco Use  . Smoking status:  Never Smoker  . Smokeless tobacco: Never Used  Substance Use Topics  . Alcohol use: No  . Drug use: No     Allergies   Penicillins and Tramadol   Review of Systems Review of Systems  Gastrointestinal: Positive for abdominal pain and diarrhea.  All other systems reviewed and are negative.    Physical Exam Updated Vital Signs BP 117/79   Pulse 76   Temp 98.4 F (36.9 C) (Oral)   Resp 20   SpO2 93%   Physical Exam Vitals signs and nursing note reviewed.  Constitutional:      General: She is not in acute distress.    Appearance: Normal appearance. She is well-developed.  HENT:     Head: Normocephalic and atraumatic.     Right Ear: Hearing normal.     Left Ear: Hearing normal.     Nose: Nose normal.  Eyes:     Conjunctiva/sclera: Conjunctivae normal.     Pupils: Pupils are equal, round, and reactive to light.  Neck:     Musculoskeletal: Normal range of motion and neck supple.  Cardiovascular:     Rate and Rhythm: Regular rhythm.     Heart sounds: S1 normal and S2 normal. No murmur. No friction rub. No gallop.   Pulmonary:     Effort: Pulmonary effort is normal. No respiratory distress.     Breath sounds:  Normal breath sounds.  Chest:     Chest wall: No tenderness.  Abdominal:     General: Bowel sounds are normal.     Palpations: Abdomen is soft.     Tenderness: There is generalized abdominal tenderness. There is no guarding or rebound. Negative signs include Murphy's sign and McBurney's sign.     Hernia: No hernia is present.  Musculoskeletal: Normal range of motion.  Skin:    General: Skin is warm and dry.     Findings: No rash.  Neurological:     Mental Status: She is alert and oriented to person, place, and time.     GCS: GCS eye subscore is 4. GCS verbal subscore is 5. GCS motor subscore is 6.     Cranial Nerves: No cranial nerve deficit.     Sensory: No sensory deficit.     Coordination: Coordination normal.  Psychiatric:        Speech: Speech  normal.        Behavior: Behavior normal.        Thought Content: Thought content normal.      ED Treatments / Results  Labs (all labs ordered are listed, but only abnormal results are displayed) Labs Reviewed  COMPREHENSIVE METABOLIC PANEL - Abnormal; Notable for the following components:      Result Value   Glucose, Bld 116 (*)    Creatinine, Ser 1.10 (*)    AST 45 (*)    Total Bilirubin 1.5 (*)    GFR calc non Af Amer 52 (*)    All other components within normal limits  CBC - Abnormal; Notable for the following components:   Hemoglobin 15.5 (*)    HCT 46.2 (*)    All other components within normal limits  URINALYSIS, ROUTINE W REFLEX MICROSCOPIC - Abnormal; Notable for the following components:   Hgb urine dipstick SMALL (*)    All other components within normal limits  LIPASE, BLOOD    EKG None  Radiology Ct Abdomen Pelvis W Contrast  Result Date: 10/31/2018 CLINICAL DATA:  Generalized abdominal pain and cramping with diarrhea. Symptoms 1 month getting worse. EXAM: CT ABDOMEN AND PELVIS WITH CONTRAST TECHNIQUE: Multidetector CT imaging of the abdomen and pelvis was performed using the standard protocol following bolus administration of intravenous contrast. CONTRAST:  141mL OMNIPAQUE IOHEXOL 300 MG/ML  SOLN COMPARISON:  None. FINDINGS: Lower chest: Normal. Hepatobiliary: Previous cholecystectomy. Liver and biliary tree are normal. Pancreas: Normal. Spleen: Normal. Adrenals/Urinary Tract: Adrenal glands are normal. Kidneys are normal in size without hydronephrosis or focal mass. Ureters and bladder are normal. Stomach/Bowel: Stomach is normal. Moderate size diverticulum over the duodenum. Remainder of the small bowel is within normal. Appendix is normal. Minimal diverticulosis of the colon. Vascular/Lymphatic: Minimal calcified plaque over the abdominal aorta. No adenopathy. Reproductive: Previous hysterectomy. Other: No free fluid or focal inflammatory change. Musculoskeletal:  Degenerative change of the spine with multilevel disc disease over the lumbar spine. Mild degenerate change of the hips. IMPRESSION: No acute findings in the abdomen/pelvis. Mild colonic diverticulosis.  Moderate duodenal diverticulum. Aortic Atherosclerosis (ICD10-I70.0). Electronically Signed   By: Marin Olp M.D.   On: 10/31/2018 01:38    Procedures Procedures (including critical care time)  Medications Ordered in ED Medications  sodium chloride flush (NS) 0.9 % injection 3 mL (has no administration in time range)  hyoscyamine (LEVSIN, ANASPAZ) tablet 0.25 mg (has no administration in time range)  ondansetron (ZOFRAN) injection 4 mg (4 mg Intravenous Given 10/31/18 0005)  morphine 4 MG/ML injection 4 mg (4 mg Intravenous Given 10/31/18 0006)  sodium chloride 0.9 % bolus 500 mL (0 mLs Intravenous Stopped 10/31/18 0202)  iohexol (OMNIPAQUE) 300 MG/ML solution 100 mL (100 mLs Intravenous Contrast Given 10/31/18 0032)     Initial Impression / Assessment and Plan / ED Course  I have reviewed the triage vital signs and the nursing notes.  Pertinent labs & imaging results that were available during my care of the patient were reviewed by me and considered in my medical decision making (see chart for details).     Patient presents to the emergency department for evaluation of abdominal pain.  Patient has been experiencing predominantly right-sided abdominal cramping for some time.  Initially she said 2 weeks but it actually has probably been months.  She seems to have problems after eating.  She saw her primary doctor for this and was told to watch her fat intake as she has had a previous cholecystectomy.  She has not noticed any specific foods that cause the problem.  Patient reports severe cramping followed by diarrhea after meals.  Examination revealed diffuse tenderness, no guarding, no rebound or focal tenderness.  CT scan unremarkable.  Lab work unremarkable.  No evidence of constipation,  blockage, colitis or other abnormality on CT.  Will treat with Levsin, antidiarrheal, follow-up with gastroenterology.  Final Clinical Impressions(s) / ED Diagnoses   Final diagnoses:  Generalized abdominal pain  Diarrhea, unspecified type    ED Discharge Orders         Ordered    hyoscyamine (LEVSIN SL) 0.125 MG SL tablet  Every 4 hours PRN     10/31/18 0239    diphenoxylate-atropine (LOMOTIL) 2.5-0.025 MG tablet  4 times daily PRN     10/31/18 0239           Orpah Greek, MD 10/31/18 [phone]

## 2018-10-31 ENCOUNTER — Emergency Department (HOSPITAL_COMMUNITY): Payer: Medicare Other

## 2018-10-31 MED ORDER — HYOSCYAMINE SULFATE 0.125 MG SL SUBL
0.2500 mg | SUBLINGUAL_TABLET | Freq: Once | SUBLINGUAL | Status: AC
  Administered 2018-10-31: 0.25 mg via SUBLINGUAL
  Filled 2018-10-31: qty 2

## 2018-10-31 MED ORDER — DIPHENOXYLATE-ATROPINE 2.5-0.025 MG PO TABS: 1.0000 | ORAL_TABLET | Freq: Four times a day (QID) | ORAL | 0 refills | Status: AC | PRN

## 2018-10-31 MED ORDER — HYOSCYAMINE SULFATE 0.125 MG PO TABS
0.2500 mg | ORAL_TABLET | Freq: Once | ORAL | Status: DC
  Filled 2018-10-31: qty 2

## 2018-10-31 MED ORDER — IOHEXOL 300 MG/ML  SOLN
100.0000 mL | Freq: Once | INTRAMUSCULAR | Status: AC | PRN
  Administered 2018-10-31: 100 mL via INTRAVENOUS

## 2018-10-31 MED ORDER — HYOSCYAMINE SULFATE 0.125 MG SL SUBL: 0.1250 mg | SUBLINGUAL_TABLET | SUBLINGUAL | 0 refills | Status: AC | PRN

## 2018-10-31 NOTE — ED Notes (Signed)
Patient discharged from facility. Patient verbalized understanding of discharge instructions. No signature pad available. Patient ambulating well out of the building with her husband.

## 2018-10-31 NOTE — ED Notes (Signed)
Patient taken to CT.

## 2018-10-31 NOTE — ED Notes (Signed)
Patient returned from CT

## 2018-11-02 ENCOUNTER — Encounter: Payer: Self-pay | Admitting: Gastroenterology

## 2018-11-06 ENCOUNTER — Ambulatory Visit: Payer: Medicare Other | Admitting: Gastroenterology

## 2018-11-06 ENCOUNTER — Encounter: Payer: Self-pay | Admitting: Gastroenterology

## 2018-11-06 VITALS — BP 104/74 | HR 96 | Ht 64.0 in | Wt 220.4 lb

## 2018-11-06 DIAGNOSIS — Z1211 Encounter for screening for malignant neoplasm of colon: Secondary | ICD-10-CM | POA: Diagnosis not present

## 2018-11-06 DIAGNOSIS — R1031 Right lower quadrant pain: Secondary | ICD-10-CM | POA: Diagnosis not present

## 2018-11-06 MED ORDER — NA SULFATE-K SULFATE-MG SULF 17.5-3.13-1.6 GM/177ML PO SOLN: 1.0000 | Freq: Once | ORAL | 0 refills | Status: AC

## 2018-11-06 NOTE — Patient Instructions (Signed)
You have been scheduled for a colonoscopy. Please follow written instructions given to you at your visit today.  Please pick up your prep supplies at the pharmacy within the next 1-3 days. If you use inhalers (even only as needed), please bring them with you on the day of your procedure.  Follow up with your PMD  If you are age 67 or older, your body mass index should be between 23-30. Your Body mass index is 37.83 kg/m. If this is out of the aforementioned range listed, please consider follow up with your Primary Care Provider.  If you are age 60 or younger, your body mass index should be between 19-25. Your Body mass index is 37.83 kg/m. If this is out of the aformentioned range listed, please consider follow up with your Primary Care Provider.    I appreciate the  opportunity to care for you  Thank You   Harl Bowie , MD

## 2018-11-06 NOTE — Progress Notes (Signed)
Helen Fuller    [id number]    [birth year]  Primary Care Physician:Avbuere, Christean Grief, MD  Referring Physician: Nolene Ebbs, MD [address] [place], [place] [address]  Chief complaint: Right lower quadrant abdominal pain HPI: 68 year old female here for new patient consult with complaints of right lower quadrant abdominal pain worse in the past 2 months. She presented to ER with severe pain last week.  CT abdomen pelvis October 31, 2018 showed no acute pathology in abdomen and pelvis.  Mild colonic diverticulosis and medium sized duodenal diverticulum otherwise unremarkable exam. She is s/p cholecyctectomy in 1980  Complains of increased bowel frequency with bowel movement after every meal, denies liquid stool or blood in stool.  Colonoscopy about 15 years ago in [place], was normal per patient.  Status post bilateral knee replacement.  Right lower quadrant pain worse with walking or standing for prolonged period of time.  Pain improves when she sits down or lays back.  Denies any nausea, vomiting, loss of appetite, weight loss, melena or bright red blood per rectum    Outpatient Encounter Medications as of 11/06/2018  Medication Sig  . hydrochlorothiazide (HYDRODIURIL) 25 MG tablet Take 25 mg by mouth daily.  . hyoscyamine (LEVSIN SL) 0.125 MG SL tablet Place 1 tablet (0.125 mg total) under the tongue every 4 (four) hours as needed.  . metoprolol (LOPRESSOR) 100 MG tablet Take 100 mg by mouth daily as needed (palpitations).   Marland Kitchen omeprazole (PRILOSEC) 20 MG capsule Take 20 mg by mouth daily.  . diphenoxylate-atropine (LOMOTIL) 2.5-0.025 MG tablet Take 1 tablet by mouth 4 (four) times daily as needed for diarrhea or loose stools.   No facility-administered encounter medications on file as of 11/06/2018.     Allergies as of 11/06/2018 - Review Complete 11/06/2018  Allergen Reaction Noted  . Penicillins Hives and Itching 07/15/2018  . Tramadol Itching and  Swelling 10/05/2015    Past Medical History:  Diagnosis Date  . Arthritis   . GERD (gastroesophageal reflux disease)   . High cholesterol   . Hypertension     Past Surgical History:  Procedure Laterality Date  . ABDOMINAL HYSTERECTOMY    . CHOLECYSTECTOMY    . REPLACEMENT TOTAL KNEE BILATERAL      Family History  Problem Relation Age of Onset  . Hypertension Mother   . Breast cancer Neg Hx     Social History   Socioeconomic History  . Marital status: Single    Spouse name: Not on file  . Number of children: 4  . Years of education: Not on file  . Highest education level: Not on file  Occupational History  . Not on file  Social Needs  . Financial resource strain: Not on file  . Food insecurity:    Worry: Not on file    Inability: Not on file  . Transportation needs:    Medical: Not on file    Non-medical: Not on file  Tobacco Use  . Smoking status: Never Smoker  . Smokeless tobacco: Never Used  Substance and Sexual Activity  . Alcohol use: No  . Drug use: No  . Sexual activity: Not on file  Lifestyle  . Physical activity:    Days per week: Not on file    Minutes per session: Not on file  . Stress: Not on file  Relationships  . Social connections:    Talks on phone: Not on file    Gets together:  Not on file    Attends religious service: Not on file    Active member of club or organization: Not on file    Attends meetings of clubs or organizations: Not on file    Relationship status: Not on file  . Intimate partner violence:    Fear of current or ex partner: Not on file    Emotionally abused: Not on file    Physically abused: Not on file    Forced sexual activity: Not on file  Other Topics Concern  . Not on file  Social History Narrative  . Not on file      Review of systems: Review of Systems  Constitutional: Negative for fever and chills.  HENT: Negative.   Eyes: Negative for blurred vision.  Respiratory: Negative for cough, shortness of  breath and wheezing.   Cardiovascular: Negative for chest pain and palpitations.  Gastrointestinal: as per HPI Genitourinary: Negative for dysuria, urgency, frequency and hematuria.  Musculoskeletal: Positive for myalgias, back pain and joint pain.  Skin: Negative for itching and rash.  Neurological: Negative for dizziness, tremors, focal weakness, seizures and loss of consciousness.  Endo/Heme/Allergies: Positive for seasonal allergies.  Psychiatric/Behavioral: Negative for depression, suicidal ideas and hallucinations.  All other systems reviewed and are negative.   Physical Exam: Vitals:   11/06/18 1359  BP: 104/74  Pulse: 96   Body mass index is 37.83 kg/m. Gen:      No acute distress HEENT:  EOMI, sclera anicteric Neck:     No masses; no thyromegaly Lungs:    Clear to auscultation bilaterally; normal respiratory effort CV:         Regular rate and rhythm; no murmurs Abd:      + bowel sounds; soft, non-tender; no palpable masses, no distension Ext:    No edema; adequate peripheral perfusion Skin:      Warm and dry; no rash Neuro: alert and oriented x 3 Psych: normal mood and affect  Data Reviewed:  Reviewed labs, radiology imaging, old records and pertinent past GI work up   Assessment and Plan/Recommendations:  68 year old female with obesity hyperlipidemia, hypertension, status post bilateral knee replacement for osteoarthritis with right lower quadrant abdominal pain Recent CT abdomen pelvis October 31, 2018 done in the ER was negative for any acute intra-abdominal pathology Right lower quadrant pain likely musculoskeletal or secondary to right hip arthritis is a significantly worse with activity especially walking and standing.  Advised patient to follow-up with PMD She is due for colorectal cancer screening, scheduled for colonoscopy The risks and benefits as well as alternatives of endoscopic procedure(s) have been discussed and reviewed. All questions answered.  The patient agrees to proceed.    Damaris Hippo , MD [phone]    CC: Nolene Ebbs, MD

## 2018-11-09 ENCOUNTER — Encounter: Payer: Self-pay | Admitting: Gastroenterology

## 2018-11-13 ENCOUNTER — Ambulatory Visit (AMBULATORY_SURGERY_CENTER): Payer: Medicare Other | Admitting: Gastroenterology

## 2018-11-13 ENCOUNTER — Encounter: Payer: Self-pay | Admitting: Gastroenterology

## 2018-11-13 VITALS — BP 107/75 | HR 72 | Temp 99.1°F | Resp 16 | Ht 64.0 in | Wt 220.0 lb

## 2018-11-13 DIAGNOSIS — D123 Benign neoplasm of transverse colon: Secondary | ICD-10-CM

## 2018-11-13 DIAGNOSIS — Z1211 Encounter for screening for malignant neoplasm of colon: Secondary | ICD-10-CM

## 2018-11-13 MED ORDER — SODIUM CHLORIDE 0.9 % IV SOLN: 500.0000 mL | Freq: Once | INTRAVENOUS | Status: DC

## 2018-11-13 NOTE — Progress Notes (Signed)
Called to room to assist during endoscopic procedure.  Patient ID and intended procedure confirmed with present staff. Received instructions for my participation in the procedure from the performing physician.  

## 2018-11-13 NOTE — Op Note (Signed)
[place] Patient Name: Helen Fuller Procedure Date: 11/13/2018 1:55 PM MRN: [id number] Endoscopist: Mauri Pole , MD Age: 68 Referring MD:  Date of Birth: 12/27/1950 Gender: Female Account #: [id number] Procedure:                Colonoscopy Indications:              Screening for colorectal malignant neoplasm Medicines:                Monitored Anesthesia Care Procedure:                Pre-Anesthesia Assessment:                           - Prior to the procedure, a History and Physical                            was performed, and patient medications and                            allergies were reviewed. The patient's tolerance of                            previous anesthesia was also reviewed. The risks                            and benefits of the procedure and the sedation                            options and risks were discussed with the patient.                            All questions were answered, and informed consent                            was obtained. Prior Anticoagulants: The patient has                            taken no previous anticoagulant or antiplatelet                            agents. ASA Grade Assessment: II - A patient with                            mild systemic disease. After reviewing the risks                            and benefits, the patient was deemed in                            satisfactory condition to undergo the procedure.                           After obtaining informed consent, the colonoscope  was passed under direct vision. Throughout the                            procedure, the patient's blood pressure, pulse, and                            oxygen saturations were monitored continuously. The                            Model PCF-[id number]) scope was introduced                            through the anus and advanced to the the cecum,                            identified  by appendiceal orifice and ileocecal                            valve. The colonoscopy was performed without                            difficulty. The patient tolerated the procedure                            well. The quality of the bowel preparation was                            excellent. The ileocecal valve, appendiceal                            orifice, and rectum were photographed. Scope In: 2:05:25 PM Scope Out: 2:17:13 PM Scope Withdrawal Time: 0 hours 8 minutes 37 seconds  Total Procedure Duration: 0 hours 11 minutes 48 seconds  Findings:                 The perianal and digital rectal examinations were                            normal.                           A 1 mm polyp was found in the transverse colon. The                            polyp was sessile. The polyp was removed with a                            cold biopsy forceps. Resection and retrieval were                            complete.                           Scattered small and large-mouthed diverticula were  found in the sigmoid colon, descending colon and                            ascending colon.                           Non-bleeding internal hemorrhoids were found during                            retroflexion. The hemorrhoids were small. Complications:            No immediate complications. Estimated Blood Loss:     Estimated blood loss was minimal. Impression:               - One 1 mm polyp in the transverse colon, removed                            with a cold biopsy forceps. Resected and retrieved.                           - Mild diverticulosis in the sigmoid colon, in the                            descending colon and in the ascending colon.                           - Non-bleeding internal hemorrhoids. Recommendation:           - Patient has a contact number available for                            emergencies. The signs and symptoms of potential                             delayed complications were discussed with the                            patient. Return to normal activities tomorrow.                            Written discharge instructions were provided to the                            patient.                           - Resume previous diet.                           - Continue present medications.                           - Await pathology results.                           - Repeat colonoscopy in 7-10 years for surveillance  based on pathology results. Mauri Pole, MD 11/13/2018 2:26:13 PM This report has been signed electronically.

## 2018-11-13 NOTE — Progress Notes (Signed)
PT taken to PACU. Monitors in place. VSS. Report given to RN. 

## 2018-11-16 ENCOUNTER — Telehealth: Payer: Self-pay

## 2018-11-16 NOTE — Telephone Encounter (Signed)
  Follow up Call-  Call back number 11/13/2018  Post procedure Call Back phone  # [phone]  Permission to leave phone message Yes  Some recent data might be hidden     Patient questions:  Do you have a fever, pain , or abdominal swelling? No. Pain Score  0 *  Have you tolerated food without any problems? Yes.    Have you been able to return to your normal activities? Yes.    Do you have any questions about your discharge instructions: Diet   No. Medications  No. Follow up visit  No.  Do you have questions or concerns about your Care? No.  Actions: * If pain score is 4 or above: No action needed, pain <4.

## 2018-11-26 ENCOUNTER — Encounter: Payer: Self-pay | Admitting: Gastroenterology

## 2019-07-02 ENCOUNTER — Other Ambulatory Visit: Payer: Self-pay

## 2019-07-02 DIAGNOSIS — Z20822 Contact with and (suspected) exposure to covid-19: Secondary | ICD-10-CM

## 2019-07-04 LAB — NOVEL CORONAVIRUS, NAA: SARS-CoV-2, NAA: NOT DETECTED

## 2019-07-14 ENCOUNTER — Other Ambulatory Visit: Payer: Self-pay

## 2019-07-14 ENCOUNTER — Ambulatory Visit: Payer: Medicare Other | Admitting: Podiatry

## 2019-07-14 DIAGNOSIS — L603 Nail dystrophy: Secondary | ICD-10-CM | POA: Diagnosis not present

## 2019-07-14 DIAGNOSIS — M79674 Pain in right toe(s): Secondary | ICD-10-CM | POA: Diagnosis not present

## 2019-07-19 NOTE — Progress Notes (Signed)
   Subjective: 68 y.o. female presenting today with a chief complaint of an injury to the right 5th toenail that occurred four days ago. She states she hit the nail against something causing the nail to loosen. She reports associated pain and bleeding. She states she hit the nail again yesterday. She has not had any treatment for the symptoms. Patient is here for further evaluation and treatment.   Past Medical History:  Diagnosis Date  . Arthritis   . GERD (gastroesophageal reflux disease)   . High cholesterol   . Hypertension     Objective:  General: Well developed, nourished, in no acute distress, alert and oriented x3   Dermatology: Hyperkeratotic, discolored, thickened, onychodystrophy of the right fifth toenail. Skin is warm, dry and supple bilateral lower extremities. Negative for open lesions or macerations.  Vascular: Dorsalis Pedis artery and Posterior Tibial artery pedal pulses palpable. No lower extremity edema noted.   Neruologic: Grossly intact via light touch bilateral.  Musculoskeletal: Muscular strength within normal limits in all groups bilateral. Normal range of motion noted to all pedal and ankle joints.   Assessment:  #1 Dystrophic nail of the right fifth toenail   Plan of Care:  1. Patient evaluated.  2. Discussed treatment alternatives and plan of care. Explained nail avulsion procedure and post procedure course to patient. 3. Patient opted for total temporary nail avulsion of the right fifth toe.  4. Prior to procedure, local anesthesia infiltration utilized using 3 ml of a 50:50 mixture of 2% plain lidocaine and 0.5% plain marcaine in a normal hallux block fashion and a betadine prep performed.  5. Light dressing applied. 6. Return to clinic as needed.   Edrick Kins, DPM [facility]  Dr. Edrick Kins, [address]                                        [place], [place] [address]                Office [phone]  Fax  [phone]

## 2019-10-03 IMAGING — CT CT ABD-PELV W/ CM
2 of 5 series · 16 of 46 positions shown, 18 images · IV contrast (Omni 300)
Comparison: None.

CLINICAL DATA: Generalized abdominal pain and cramping with
diarrhea. Symptoms 1 month getting worse.

EXAM:
CT ABDOMEN AND PELVIS WITH CONTRAST
TECHNIQUE: Multidetector CT imaging of the abdomen and pelvis was performed
using the standard protocol following bolus administration of
intravenous contrast.
CONTRAST:  100mL OMNIPAQUE IOHEXOL 300 MG/ML  SOLN

[Series 3: a/p w/ 5mm · axial · 0.96mm/px · z∈[-426,-46]mm · 13 of 88 slices shown, 15 images]
[im 6/88  soft-tissue]
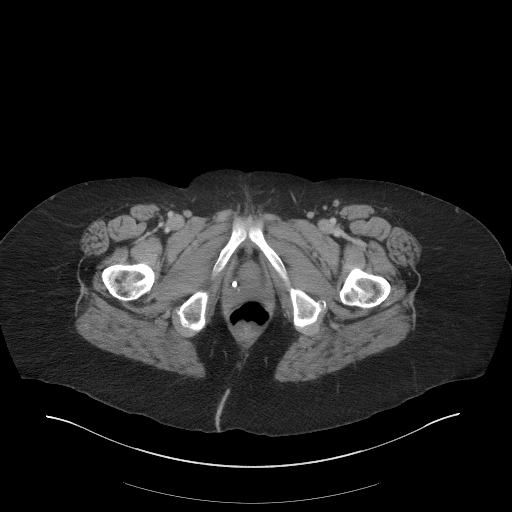
[im 6/88  bone]
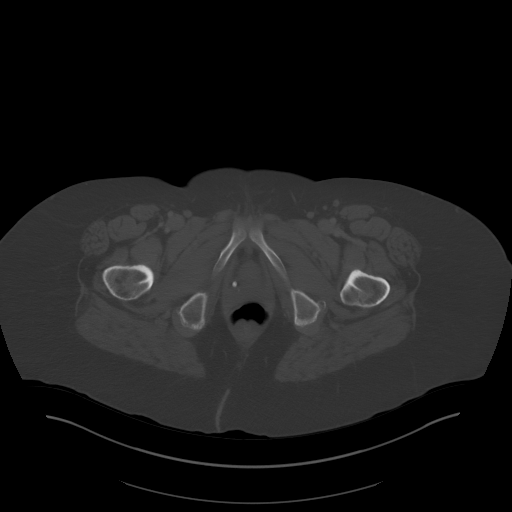
[im 11/88  soft-tissue]
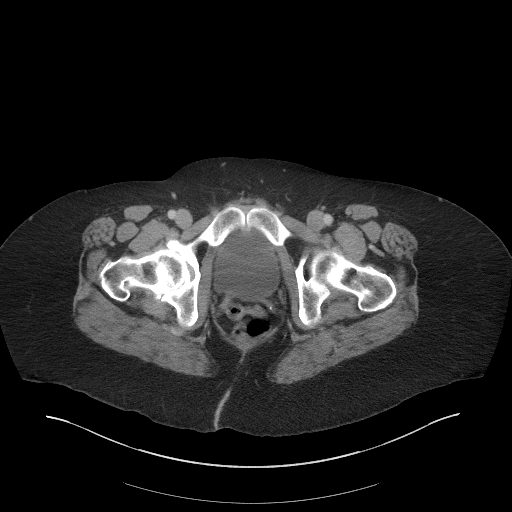
[im 21/88  soft-tissue]
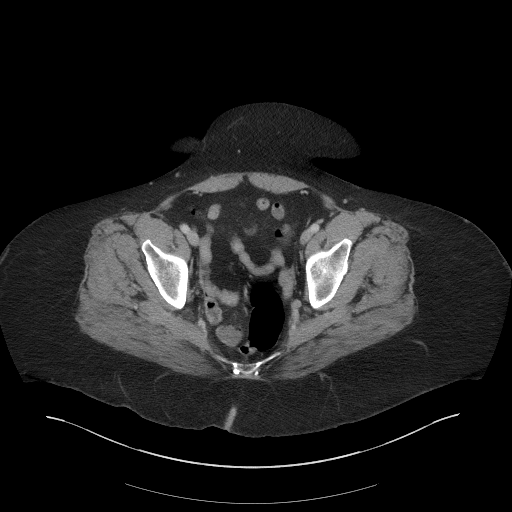
[im 26/88  soft-tissue]
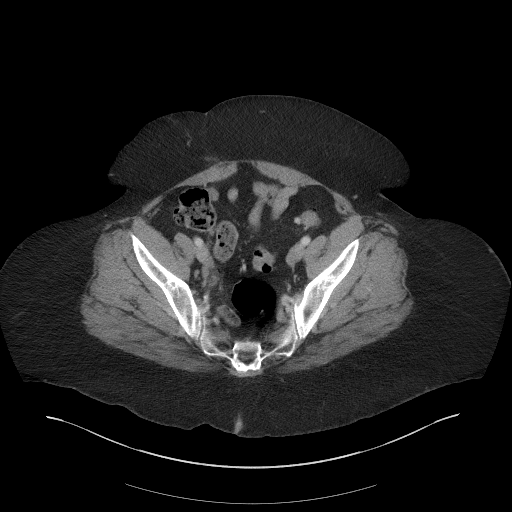
[im 31/88  soft-tissue]
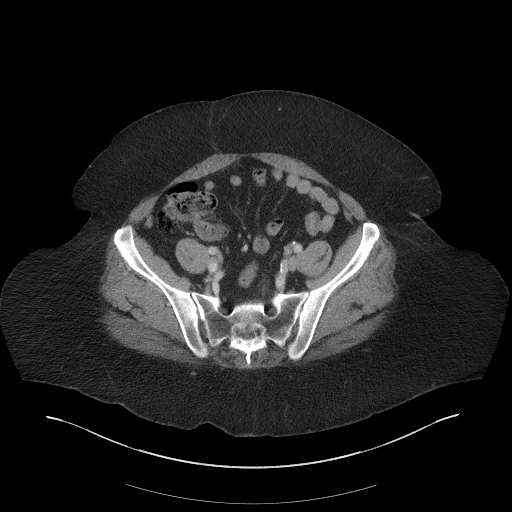
[im 36/88  soft-tissue]
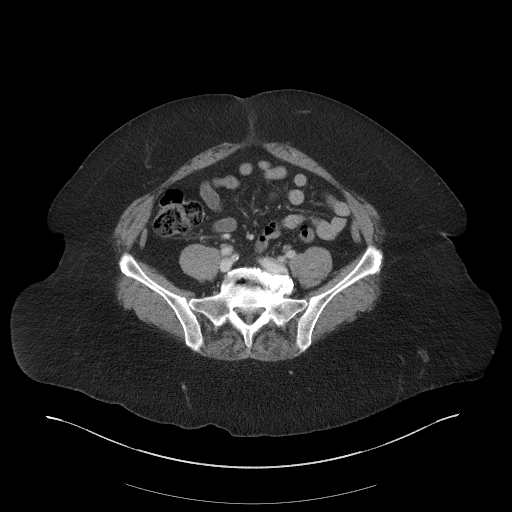
[im 47/88  soft-tissue]
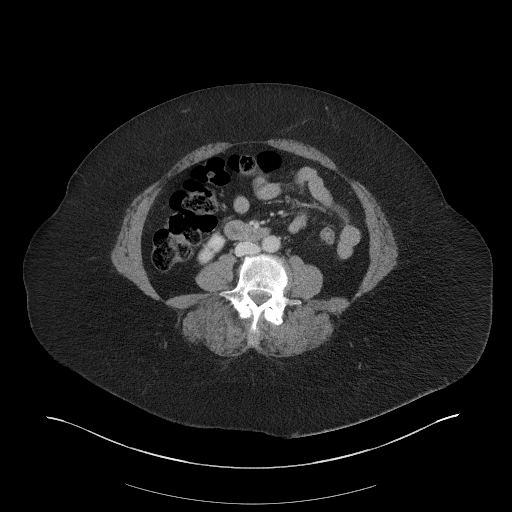
[im 52/88  soft-tissue]
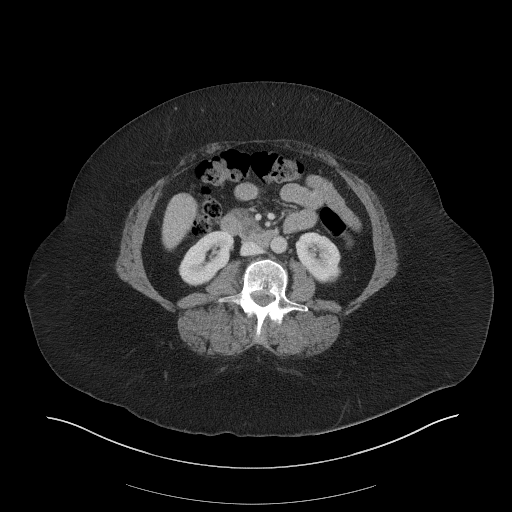
[im 57/88  soft-tissue]
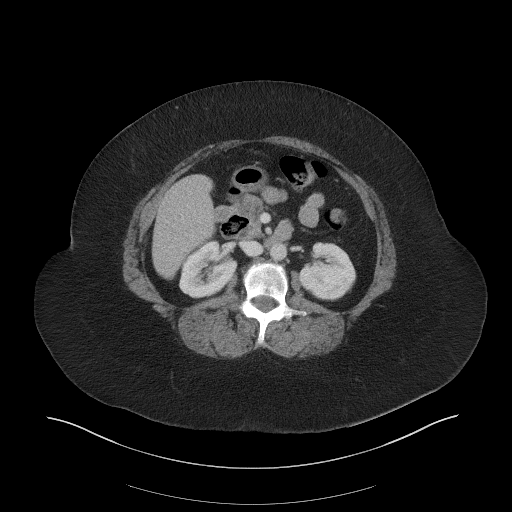
[im 57/88  bone]
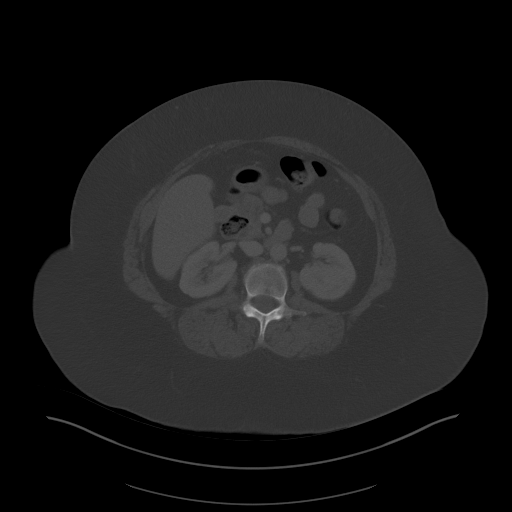
[im 62/88  soft-tissue]
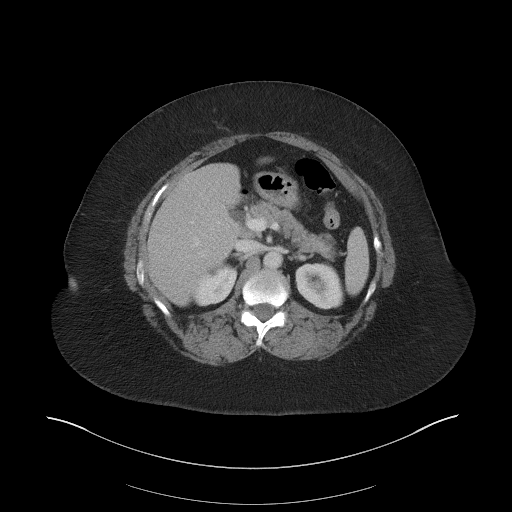
[im 67/88  soft-tissue]
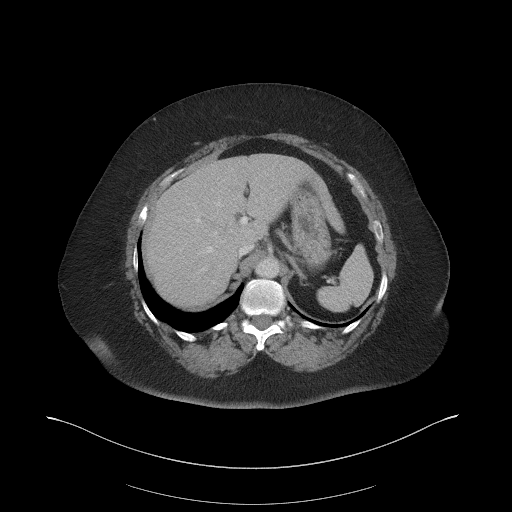
[im 77/88  soft-tissue]
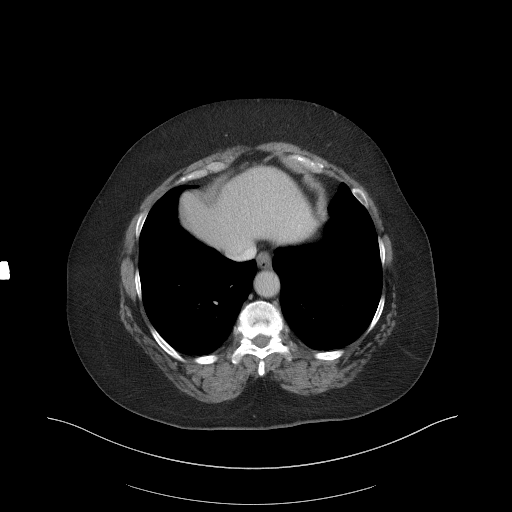
[im 82/88  soft-tissue]
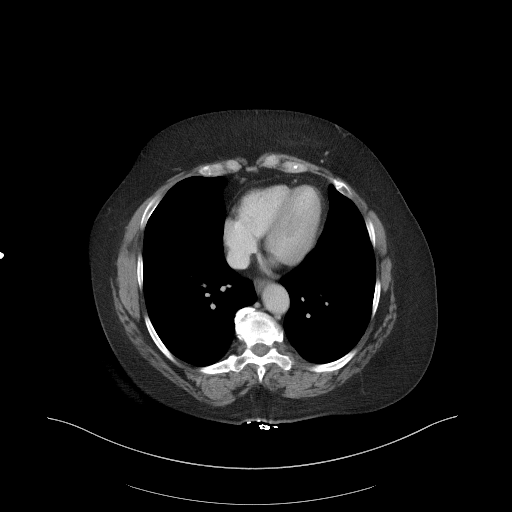

[Series 6: a/p w/ cor · coronal · 0.69mm/px · 3 of 147 slices shown]
[im 49/147  soft-tissue]
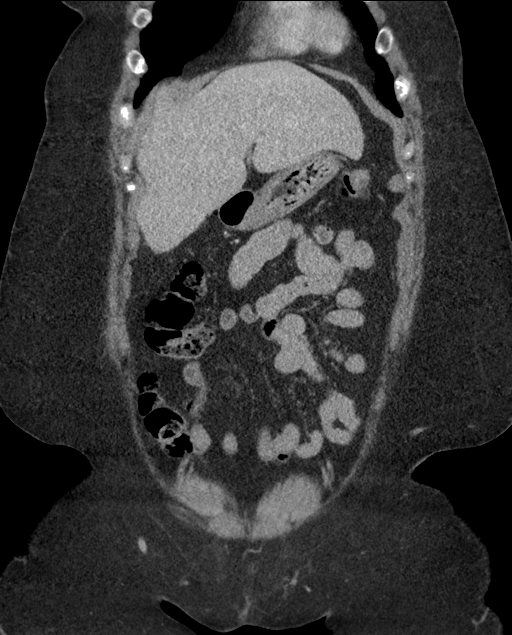
[im 65/147  soft-tissue]
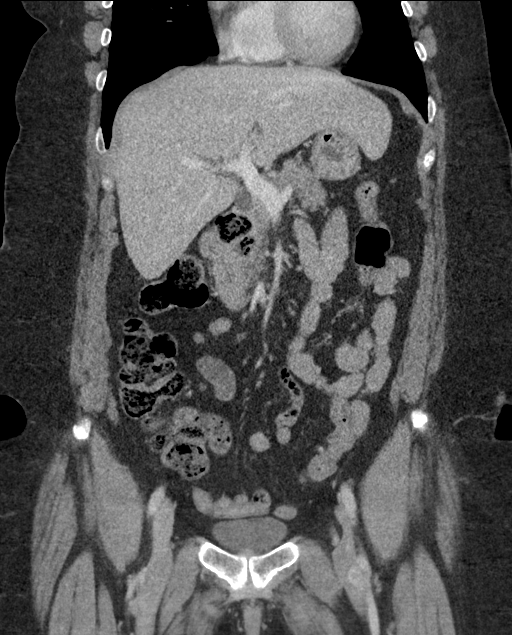
[im 82/147  soft-tissue]
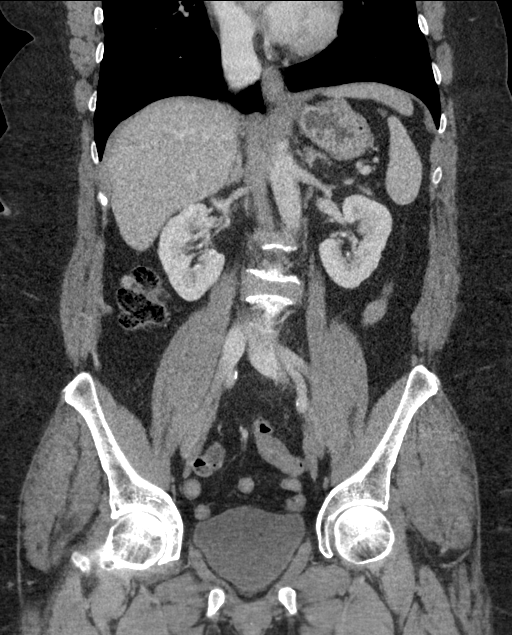

[16 of 46 positions shown; findings below may reference images not displayed]

FINDINGS: Lower chest: Normal.

Hepatobiliary: Previous cholecystectomy. Liver and biliary tree are
normal.

Pancreas: Normal.

Spleen: Normal.

Adrenals/Urinary Tract: Adrenal glands are normal. Kidneys are
normal in size without hydronephrosis or focal mass. Ureters and
bladder are normal.

Stomach/Bowel: Stomach is normal. Moderate size diverticulum over
the duodenum. Remainder of the small bowel is within normal.
Appendix is normal. Minimal diverticulosis of the colon.

Vascular/Lymphatic: Minimal calcified plaque over the abdominal
aorta. No adenopathy.

Reproductive: Previous hysterectomy.

Other: No free fluid or focal inflammatory change.

Musculoskeletal: Degenerative change of the spine with multilevel
disc disease over the lumbar spine. Mild degenerate change of the
hips.
IMPRESSION: No acute findings in the abdomen/pelvis.

Mild colonic diverticulosis.  Moderate duodenal diverticulum.

Aortic Atherosclerosis (455LL-PDB.B).

## 2019-10-20 ENCOUNTER — Other Ambulatory Visit: Payer: Self-pay | Admitting: Internal Medicine

## 2019-11-08 ENCOUNTER — Ambulatory Visit (HOSPITAL_COMMUNITY)
Admission: EM | Admit: 2019-11-08 | Discharge: 2019-11-08 | Disposition: A | Discharge status: Home / Self Care | Payer: Medicare Other

## 2019-11-08 ENCOUNTER — Other Ambulatory Visit: Payer: Self-pay

## 2019-11-08 NOTE — ED Notes (Signed)
Patient LWBS after waiting 8 minutes because her husband did not want to wait any longer.

## 2019-11-14 ENCOUNTER — Ambulatory Visit: Payer: Medicare Other | Attending: Internal Medicine

## 2019-11-14 DIAGNOSIS — Z23 Encounter for immunization: Secondary | ICD-10-CM | POA: Insufficient documentation

## 2019-11-14 NOTE — Progress Notes (Signed)
   Covid-19 Vaccination Clinic  Name:  Helen Fuller    MRN: [id number] DOB: Feb 23, 1951  11/14/2019  [name] was observed post Covid-19 immunization for 15 minutes without incidence. She was provided with Vaccine Information Sheet and instruction to access the V-Safe system.   [name] was instructed to call 911 with any severe reactions post vaccine: Marland Kitchen Difficulty breathing  . Swelling of your face and throat  . A fast heartbeat  . A bad rash all over your body  . Dizziness and weakness    Immunizations Administered    Name Date Dose VIS Date Route   Pfizer COVID-19 Vaccine 11/14/2019  1:57 PM 0.3 mL 08/27/2019 Intramuscular   Manufacturer: [place]   Lot: [id number]   [place]: [id number]

## 2019-12-14 ENCOUNTER — Ambulatory Visit: Payer: Medicare Other

## 2019-12-20 ENCOUNTER — Ambulatory Visit: Payer: Medicare Other

## 2019-12-21 ENCOUNTER — Ambulatory Visit: Payer: Medicare Other

## 2019-12-29 ENCOUNTER — Ambulatory Visit: Payer: Medicare Other

## 2019-12-30 ENCOUNTER — Ambulatory Visit: Payer: Medicare Other | Attending: Internal Medicine

## 2019-12-30 DIAGNOSIS — Z23 Encounter for immunization: Secondary | ICD-10-CM

## 2019-12-30 NOTE — Progress Notes (Signed)
   Covid-19 Vaccination Clinic  Name:  Helen Fuller    MRN: [id number] DOB: Feb 08, 1951  12/30/2019  [name] was observed post Covid-19 immunization for 15 minutes without incident. She was provided with Vaccine Information Sheet and instruction to access the V-Safe system.   [name] was instructed to call 911 with any severe reactions post vaccine: Marland Kitchen Difficulty breathing  . Swelling of face and throat  . A fast heartbeat  . A bad rash all over body  . Dizziness and weakness   Immunizations Administered    Name Date Dose VIS Date Route   Pfizer COVID-19 Vaccine 12/30/2019  1:42 PM 0.3 mL 08/27/2019 Intramuscular   Manufacturer: [place]   Lot: [id number]   [place]: [id number]
